# Patient Record
Sex: Female | Born: 1965 | Race: White | Hispanic: No | Marital: Single | Smoking: Current every day smoker
Health system: Southern US, Community
[De-identification: ages and names within clinical notes are randomized; demographics above are authoritative.]

## PROBLEM LIST (undated history)

## (undated) DIAGNOSIS — Z8742 Personal history of other diseases of the female genital tract: Secondary | ICD-10-CM

## (undated) DIAGNOSIS — N200 Calculus of kidney: Secondary | ICD-10-CM

## (undated) HISTORY — PX: ENDOMETRIAL ABLATION: SHX621

## (undated) HISTORY — PX: WISDOM TOOTH EXTRACTION: SHX21

---

## 2014-02-09 ENCOUNTER — Other Ambulatory Visit: Payer: Self-pay | Admitting: Urology

## 2014-02-09 ENCOUNTER — Encounter (HOSPITAL_COMMUNITY): Payer: Self-pay | Admitting: Pharmacy Technician

## 2014-02-12 ENCOUNTER — Encounter (HOSPITAL_COMMUNITY): Payer: Self-pay | Admitting: *Deleted

## 2014-02-15 ENCOUNTER — Ambulatory Visit (HOSPITAL_COMMUNITY)
Admission: RE | Admit: 2014-02-15 | Discharge: 2014-02-15 | Disposition: A | Discharge status: Home / Self Care | Payer: BC Managed Care – PPO | Source: Ambulatory Visit | Attending: Urology | Admitting: Urology

## 2014-02-15 ENCOUNTER — Ambulatory Visit (HOSPITAL_COMMUNITY): Payer: BC Managed Care – PPO

## 2014-02-15 ENCOUNTER — Encounter (HOSPITAL_COMMUNITY)
Admission: RE | Disposition: A | Discharge status: Home / Self Care | Payer: Self-pay | Source: Ambulatory Visit | Attending: Urology

## 2014-02-15 ENCOUNTER — Encounter (HOSPITAL_COMMUNITY): Payer: Self-pay | Admitting: *Deleted

## 2014-02-15 DIAGNOSIS — R319 Hematuria, unspecified: Secondary | ICD-10-CM | POA: Insufficient documentation

## 2014-02-15 DIAGNOSIS — N200 Calculus of kidney: Secondary | ICD-10-CM | POA: Insufficient documentation

## 2014-02-15 DIAGNOSIS — F17200 Nicotine dependence, unspecified, uncomplicated: Secondary | ICD-10-CM | POA: Insufficient documentation

## 2014-02-15 HISTORY — DX: Calculus of kidney: N20.0

## 2014-02-15 HISTORY — DX: Personal history of other diseases of the female genital tract: Z87.42

## 2014-02-15 LAB — PREGNANCY, URINE: Preg Test, Ur: NEGATIVE

## 2014-02-15 SURGERY — LITHOTRIPSY, ESWL
Anesthesia: LOCAL | Laterality: Right

## 2014-02-15 MED ORDER — CIPROFLOXACIN HCL 500 MG PO TABS
500.0000 mg | ORAL_TABLET | ORAL | Status: AC
  Administered 2014-02-15: 500 mg via ORAL
  Filled 2014-02-15: qty 1

## 2014-02-15 MED ORDER — DIPHENHYDRAMINE HCL 25 MG PO CAPS
25.0000 mg | ORAL_CAPSULE | ORAL | Status: AC
  Administered 2014-02-15: 25 mg via ORAL
  Filled 2014-02-15: qty 1

## 2014-02-15 MED ORDER — OXYCODONE-ACETAMINOPHEN 5-325 MG PO TABS: 1.0000 | ORAL_TABLET | ORAL | Status: AC | PRN

## 2014-02-15 MED ORDER — SODIUM CHLORIDE 0.9 % IV SOLN
INTRAVENOUS | Status: DC
  Administered 2014-02-15: 14:00:00 via INTRAVENOUS

## 2014-02-15 MED ORDER — DIAZEPAM 5 MG PO TABS
10.0000 mg | ORAL_TABLET | ORAL | Status: AC
  Administered 2014-02-15: 10 mg via ORAL
  Filled 2014-02-15: qty 2

## 2014-02-15 MED ORDER — TAMSULOSIN HCL 0.4 MG PO CAPS: 0.4000 mg | ORAL_CAPSULE | Freq: Every day | ORAL | Status: AC

## 2014-02-15 NOTE — Op Note (Signed)
See [facility] scanned op note. Right ESWL for 13 mm UPJ stone.

## 2014-02-15 NOTE — H&P (Signed)
F/u - referred by Dr. Betty SwazilandJordan.        1 - Gross Hematuria - May 2015 - Pt with on/off gross hematuria for several weeks. UCX negative x several. 10PY smoker. No dye / chemical exposure. KUB at PCP with likely nephrolithiasis. Gross hematuria is clear today. She has some dull right flank pain.    2 - Nephrolithiasis - Apr 2015 KUB - Rt 14mm, left 6mm intrarenal stones per report. I reviewed the images. No prior episodes of stones. She has some dull right flank pain. Pain is intermittent, mild to moderate.    PMH is negative. Pt is otherwise healthy.       May 2015 interval hx  She returns and has had no further hematuria. She has no dysuria. Interestingly, she has some mild right flank pain after giving the IV contrast. Indeed the CT reveals a 12 mm right UPJ stone (skin to stone 8.5 cm, 950 HU, visible on scout) and a 6 mm LLP stone. No worrisome findings. Benign.   Past Medical History Problems  1. History of No significant past medical history  Surgical History Problems  1. History of No Surgical Problems  Current Meds 1. No Reported Medications Recorded  Allergies Medication  1. No Known Drug Allergies  Family History Problems  1. No significant family history : Mother  Social History Problems  1. Current every day smoker (305.1) 2. Daily caffeine consumption, 2-3 servings a day 3. Divorced 4. Father deceased 755. Minimum alcohol consumption  Vitals Vital Signs [Data Includes: Last 1 Day]  Recorded: 11May2015 03:44PM  Blood Pressure: 123 / 81 Temperature: 98.1 F Heart Rate: 77  Physical Exam Abdomen: The abdomen is soft and nontender. No masses are palpated. No CVA tenderness. No hernias are palpable. No hepatosplenomegaly noted.  Genitourinary:  Chaperone Present: sue.  Examination of the external genitalia shows normal female external genitalia and no lesions. The urethra is normal in appearance and not tender. There is no urethral mass. Vaginal  exam demonstrates no abnormalities. The bladder is non tender, not distended and without masses. The anus is normal on inspection. The perineum is normal on inspection.    Results/Data Urine [Data Includes: Last 1 Day]   11May2015  COLOR YELLOW   APPEARANCE CLEAR   SPECIFIC GRAVITY 1.010   pH 6.5   GLUCOSE NEG mg/dL  BILIRUBIN NEG   KETONE NEG mg/dL  BLOOD SMALL   PROTEIN NEG mg/dL  UROBILINOGEN 0.2 mg/dL  NITRITE NEG   LEUKOCYTE ESTERASE NEG   SQUAMOUS EPITHELIAL/HPF FEW   WBC NONE SEEN WBC/hpf  RBC 3-6 RBC/hpf  BACTERIA NONE SEEN   CRYSTALS NONE SEEN   CASTS NONE SEEN    Procedure  Procedure: Cystoscopy  Chaperone Present: sue.  Indication: Hematuria.  Informed Consent: Risks, benefits, and potential adverse events were discussed and informed consent was obtained from the patient.  Prep: The patient was prepped with betadine.  Procedure Note:  Urethral meatus:. No abnormalities.  Anterior urethra: No abnormalities.  Bladder: Visulization was clear. The ureteral orifices were in the normal anatomic position bilaterally and had clear efflux of urine. A systematic survey of the bladder demonstrated no bladder tumors or stones. The mucosa was smooth without abnormalities. The patient tolerated the procedure well.  Complications: None.    Assessment Assessed  1. Gross hematuria (599.71) 2. Nephrolithiasis (592.0)  Plan Health Maintenance  1. UA With REFLEX; [Do Not Release]; Status:Complete;   Done: 11May2015 01:52PM Nephrolithiasis  2. Follow-up Schedule Surgery  Office  Follow-up  Status: Complete  Done: 11May2015  Discussion/Summary Gross hematuria-benign workup.     Renal stones-we discussed the right renal stone is large, would not likely pass and is in position to cause symptoms We went over the nature risk and benefits of continued surveillance, right shockwave lithotripsy, or right ureteroscopy. All questions answered. She elects to proceed with right  shockwave lithotripsy.      Signatures Electronically signed by : Jerilee FieldMatthew Vici Novick, M.D.; Feb 08 2014  5:07PM EST

## 2014-02-15 NOTE — Interval H&P Note (Signed)
History and Physical Interval Note:  02/15/2014 3:14 PM  Dominique Joyce  has presented today for surgery, with the diagnosis of Right Renal Stone  The various methods of treatment have been discussed with the patient and family. After consideration of risks, benefits and other options for treatment, the patient has consented to  Procedure(s): RIGHT EXTRACORPOREAL SHOCK WAVE LITHOTRIPSY (ESWL) (Right) as a surgical intervention .  The patient's history has been reviewed, patient examined, no change in status, stable for surgery.  I have reviewed the patient's chart and labs.  Questions were answered to the patient's satisfaction.  KUB reviewed. She elects to proceed.    Jerilee FieldMatthew Danessa Mensch

## 2014-02-15 NOTE — Discharge Instructions (Signed)
Lithotripsy, Care After °Refer to this sheet in the next few weeks. These instructions provide you with information on caring for yourself after your procedure. Your health care provider may also give you more specific instructions. Your treatment has been planned according to current medical practices, but problems sometimes occur. Call your health care provider if you have any problems or questions after your procedure. °WHAT TO EXPECT AFTER THE PROCEDURE  °· Your urine may have a red tinge for a few days after treatment. Blood loss is usually minimal. °· You may have soreness in the back or flank area. This usually goes away after a few days. The procedure can cause blotches or bruises on the back where the pressure wave enters the skin. These marks usually cause only minimal discomfort and should disappear in a short time. °· Stone fragments should begin to pass within 24 hours of treatment. However, a delayed passage is not unusual. °· You may have pain, discomfort, and feel sick to your stomach (nauseated) when the crushed fragments of stone are passed down the tube from the kidney to the bladder. Stone fragments can pass soon after the procedure and may last for up to 4 8 weeks. °· A small number of patients may have severe pain when stone fragments are not able to pass, which leads to an obstruction. °· If your stone is greater than 1 inch (2.5 cm) in diameter or if you have multiple stones that have a combined diameter greater than 1 inch (2.5 cm), you may require more than one treatment. °· If you had a stent placed prior to your procedure, you may experience some discomfort, especially during urination. You may experience the pain or discomfort in your flank or back, or you may experience a sharp pain or discomfort at the base of your penis or in your lower abdomen. The discomfort usually lasts only a few minutes after urinating. °HOME CARE INSTRUCTIONS  °· Rest at home until you feel your energy  improving. °· Only take over-the-counter or prescription medicines for pain, discomfort, or fever as directed by your health care provider. Depending on the type of lithotripsy, you may need to take antibiotics and anti-inflammatory medicines for a few days. °· Drink enough water and fluids to keep your urine clear or pale yellow. This helps "flush" your kidneys. It helps pass any remaining pieces of stone and prevents stones from coming back. °· Most people can resume daily activities within 1 2 days after standard lithotripsy. It can take longer to recover from laser and percutaneous lithotripsy. °· If the stones are in your urinary system, you may be asked to strain your urine at home to look for stones. Any stones that are found can be sent to a medical lab for examination. °· Visit your health care provider for a follow-up appointment in a few weeks. Your doctor may remove your stent if you have one. Your health care provider will also check to see whether stone particles still remain. °SEEK MEDICAL CARE IF:  °· Your pain is not relieved by medicine. °· You have a lasting nauseous feeling. °· You feel there is too much blood in the urine. °· You develop persistent problems with frequent or painful urination that does not at least partially improve after 2 days following the procedure. °· You have a congested cough. °· You feel lightheaded. °· You develop a rash or any other signs that might suggest an allergic problem. °· You develop any reaction or side   effects to your medicine(s). °SEEK IMMEDIATE MEDICAL CARE IF:  °· You experience severe back or flank pain or both. °· You see nothing but blood when you urinate. °· You cannot pass any urine at all. °· You have a fever or shaking chills. °· You develop shortness of breath, difficulty breathing, or chest pain. °· You develop vomiting that will not stop after 6 8 hours. °· You have a fainting episode. °Document Released: 10/07/2007 Document Revised: 07/08/2013  Document Reviewed: 04/02/2013 °[employer]® Patient Information ©2014 [employer]. ° °

## 2014-10-22 IMAGING — CR DG ABDOMEN 1V
1 series · 1 of 1 positions shown · non-contrast
Comparison: 02/08/2014

CLINICAL DATA: Pre lithotripsy evaluation

EXAM:
ABDOMEN - 1 VIEW

[t abdomen supine]
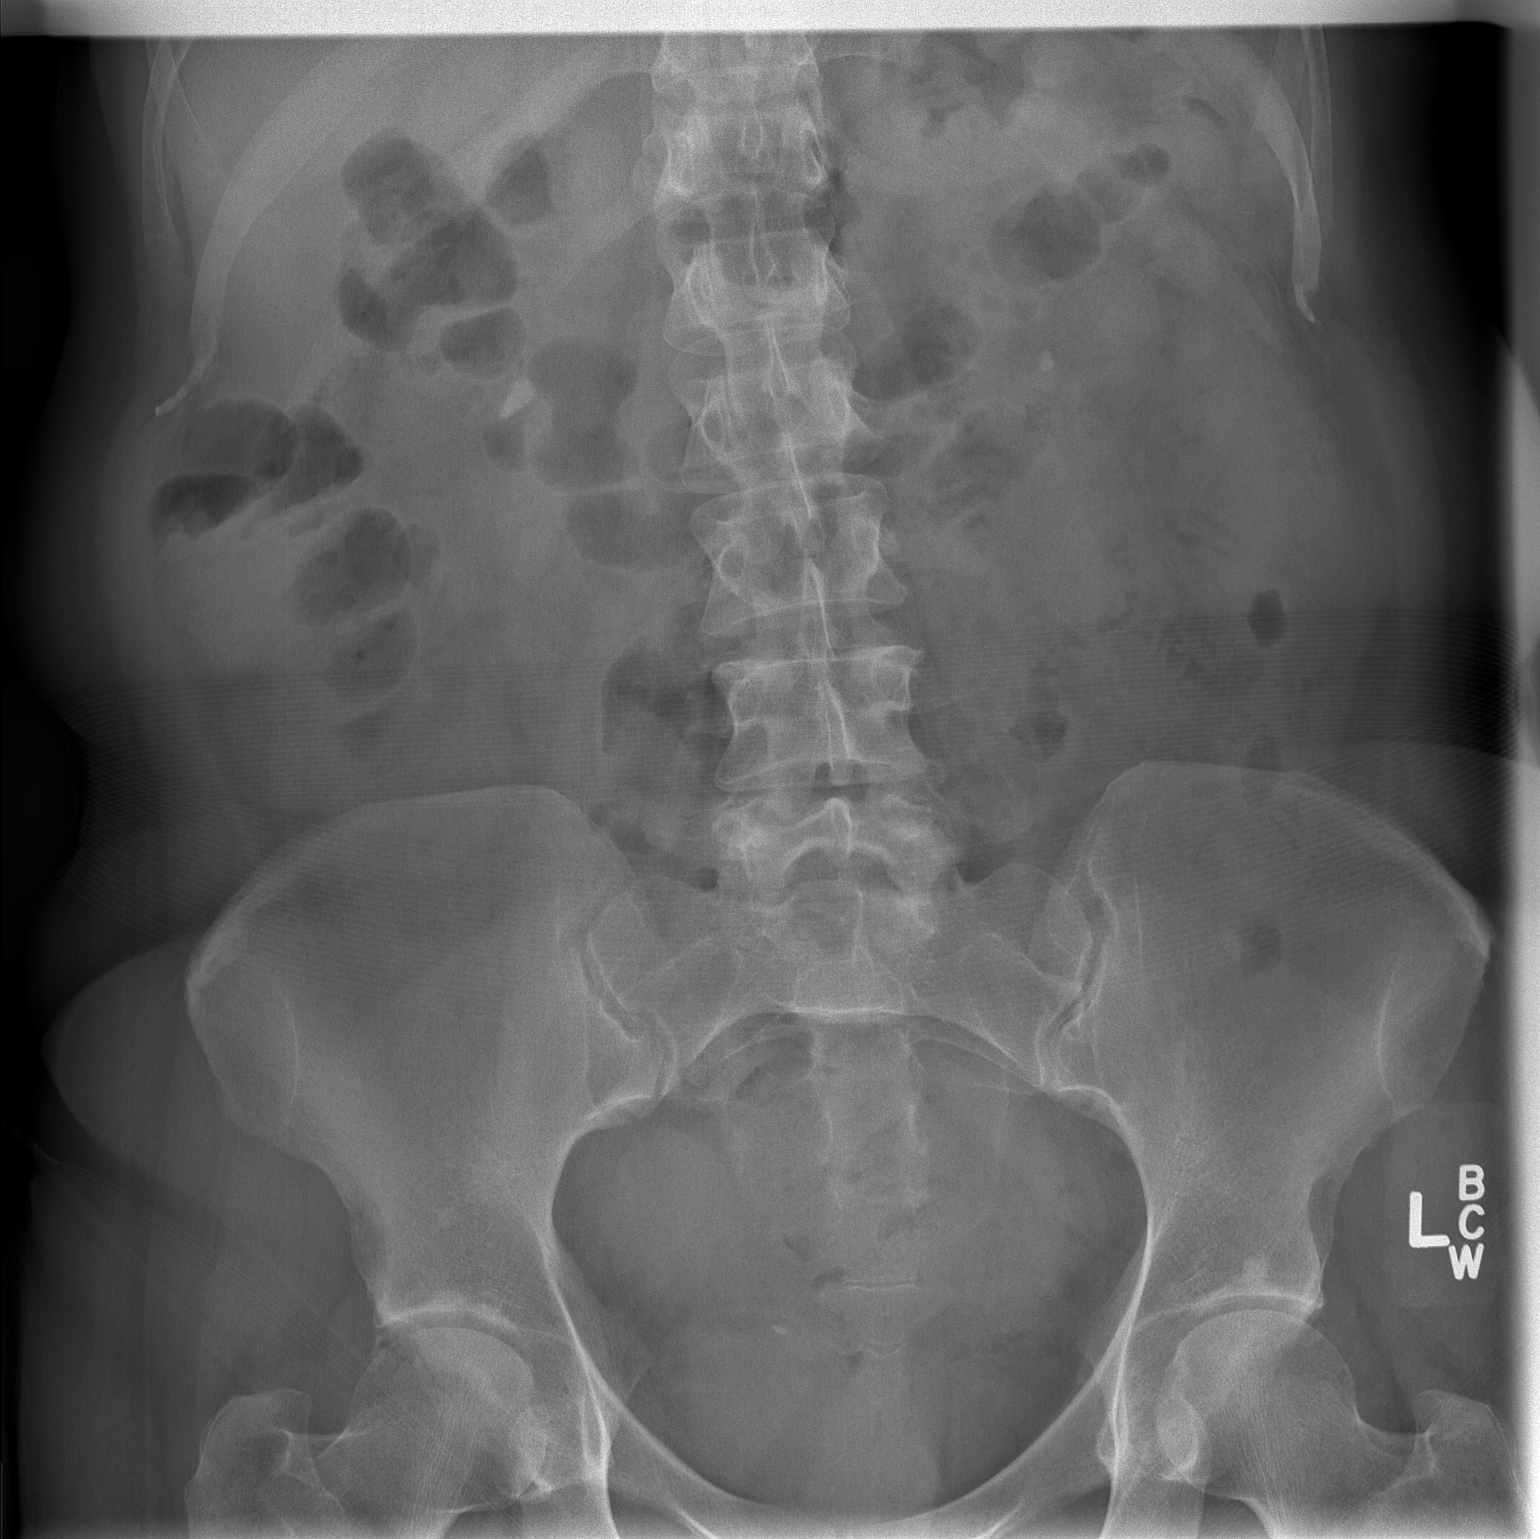

[1 of 1 positions shown; findings below may reference images not displayed]

FINDINGS: Scattered large and small bowel gas is noted. Bilateral renal
calculi are again seen and stable from the prior exam. A small
calcification is noted in the right hemipelvis which was not seen on
the recent CT examination. This could be related to a migrated small
ureteral stone.
IMPRESSION: Bilateral renal calculi stable from the prior exam. New
calcification within the right hemipelvis which may represent a
distal ureteral stone. Correlation with clinical history is
recommended.
